# Patient Record
Sex: Male | Born: 1985 | Race: White | Hispanic: No | Marital: Single | Smoking: Current every day smoker
Health system: Southern US, Community
[De-identification: ages and names within clinical notes are randomized; demographics above are authoritative.]

## PROBLEM LIST (undated history)

## (undated) DIAGNOSIS — T300 Burn of unspecified body region, unspecified degree: Secondary | ICD-10-CM

## (undated) DIAGNOSIS — W868XXA Exposure to other electric current, initial encounter: Secondary | ICD-10-CM

## (undated) HISTORY — PX: OTHER SURGICAL HISTORY: SHX169

---

## 2013-09-17 ENCOUNTER — Encounter (HOSPITAL_COMMUNITY): Payer: Self-pay | Admitting: Emergency Medicine

## 2013-09-17 ENCOUNTER — Emergency Department (HOSPITAL_COMMUNITY): Payer: Self-pay

## 2013-09-17 ENCOUNTER — Emergency Department (HOSPITAL_COMMUNITY)
Admission: EM | Admit: 2013-09-17 | Discharge: 2013-09-17 | Disposition: A | Discharge status: Home / Self Care | Payer: Self-pay | Attending: Emergency Medicine | Admitting: Emergency Medicine

## 2013-09-17 DIAGNOSIS — F17200 Nicotine dependence, unspecified, uncomplicated: Secondary | ICD-10-CM | POA: Insufficient documentation

## 2013-09-17 DIAGNOSIS — N390 Urinary tract infection, site not specified: Secondary | ICD-10-CM | POA: Insufficient documentation

## 2013-09-17 DIAGNOSIS — R1011 Right upper quadrant pain: Secondary | ICD-10-CM | POA: Insufficient documentation

## 2013-09-17 DIAGNOSIS — Z880 Allergy status to penicillin: Secondary | ICD-10-CM | POA: Insufficient documentation

## 2013-09-17 DIAGNOSIS — R112 Nausea with vomiting, unspecified: Secondary | ICD-10-CM | POA: Insufficient documentation

## 2013-09-17 HISTORY — DX: Burn of unspecified body region, unspecified degree: T30.0

## 2013-09-17 HISTORY — DX: Exposure to other electric current, initial encounter: W86.8XXA

## 2013-09-17 LAB — URINE MICROSCOPIC-ADD ON

## 2013-09-17 LAB — COMPREHENSIVE METABOLIC PANEL
ALK PHOS: 67 U/L (ref 39–117)
ALT: 14 U/L (ref 0–53)
AST: 20 U/L (ref 0–37)
Albumin: 4.5 g/dL (ref 3.5–5.2)
Anion gap: 11 (ref 5–15)
BUN: 9 mg/dL (ref 6–23)
CHLORIDE: 107 meq/L (ref 96–112)
CO2: 25 meq/L (ref 19–32)
Calcium: 9.6 mg/dL (ref 8.4–10.5)
Creatinine, Ser: 0.9 mg/dL (ref 0.50–1.35)
GFR calc Af Amer: >90 mL/min (ref >90)
Glucose, Bld: 92 mg/dL (ref 70–99)
POTASSIUM: 3.9 meq/L (ref 3.7–5.3)
SODIUM: 143 meq/L (ref 137–147)
Total Bilirubin: 0.6 mg/dL (ref 0.3–1.2)
Total Protein: 8.2 g/dL (ref 6.0–8.3)

## 2013-09-17 LAB — CBC WITH DIFFERENTIAL/PLATELET
Basophils Absolute: 0 10*3/uL (ref 0.0–0.1)
Basophils Relative: 0 % (ref 0–1)
Eosinophils Absolute: 0.2 10*3/uL (ref 0.0–0.7)
Eosinophils Relative: 2 % (ref 0–5)
HCT: 41.1 % (ref 39.0–52.0)
Hemoglobin: 13.6 g/dL (ref 13.0–17.0)
LYMPHS ABS: 2.5 10*3/uL (ref 0.7–4.0)
Lymphocytes Relative: 38 % (ref 12–46)
MCH: 30.6 pg (ref 26.0–34.0)
MCHC: 33.1 g/dL (ref 30.0–36.0)
MCV: 92.4 fL (ref 78.0–100.0)
Monocytes Absolute: 0.5 10*3/uL (ref 0.1–1.0)
Monocytes Relative: 7 % (ref 3–12)
NEUTROS ABS: 3.5 10*3/uL (ref 1.7–7.7)
NEUTROS PCT: 53 % (ref 43–77)
PLATELETS: 261 10*3/uL (ref 150–400)
RBC: 4.45 MIL/uL (ref 4.22–5.81)
RDW: 15.3 % (ref 11.5–15.5)
WBC: 6.7 10*3/uL (ref 4.0–10.5)

## 2013-09-17 LAB — URINALYSIS, ROUTINE W REFLEX MICROSCOPIC
BILIRUBIN URINE: NEGATIVE
GLUCOSE, UA: NEGATIVE mg/dL
Ketones, ur: NEGATIVE mg/dL
Leukocytes, UA: NEGATIVE
Nitrite: NEGATIVE
Specific Gravity, Urine: >1.03 — ABNORMAL HIGH (ref 1.005–1.030)
UROBILINOGEN UA: 0.2 mg/dL (ref 0.0–1.0)
pH: 5.5 (ref 5.0–8.0)

## 2013-09-17 LAB — LIPASE, BLOOD: Lipase: 37 U/L (ref 11–59)

## 2013-09-17 MED ORDER — IOHEXOL 300 MG/ML  SOLN
100.0000 mL | Freq: Once | INTRAMUSCULAR | Status: AC | PRN
  Administered 2013-09-17: 100 mL via INTRAVENOUS

## 2013-09-17 MED ORDER — SODIUM CHLORIDE 0.9 % IV SOLN
Freq: Once | INTRAVENOUS | Status: AC
  Administered 2013-09-17: 10:00:00 via INTRAVENOUS

## 2013-09-17 MED ORDER — OXYCODONE-ACETAMINOPHEN 7.5-325 MG PO TABS: 1.0000 | ORAL_TABLET | ORAL | Status: DC | PRN

## 2013-09-17 MED ORDER — CIPROFLOXACIN HCL 500 MG PO TABS: 500.0000 mg | ORAL_TABLET | Freq: Two times a day (BID) | ORAL | Status: DC

## 2013-09-17 MED ORDER — PROMETHAZINE HCL 25 MG PO TABS: 25.0000 mg | ORAL_TABLET | Freq: Four times a day (QID) | ORAL | Status: DC | PRN

## 2013-09-17 MED ORDER — ONDANSETRON HCL 4 MG/2ML IJ SOLN
4.0000 mg | Freq: Once | INTRAMUSCULAR | Status: AC
  Administered 2013-09-17: 4 mg via INTRAVENOUS
  Filled 2013-09-17: qty 2

## 2013-09-17 MED ORDER — HYDROMORPHONE HCL PF 1 MG/ML IJ SOLN
1.0000 mg | Freq: Once | INTRAMUSCULAR | Status: AC
  Administered 2013-09-17: 1 mg via INTRAVENOUS
  Filled 2013-09-17: qty 1

## 2013-09-17 MED ORDER — MORPHINE SULFATE 4 MG/ML IJ SOLN
4.0000 mg | Freq: Once | INTRAMUSCULAR | Status: AC
  Administered 2013-09-17: 4 mg via INTRAVENOUS
  Filled 2013-09-17: qty 1

## 2013-09-17 NOTE — ED Notes (Signed)
Pt alert & oriented x4, stable gait. Patient given discharge instructions, paperwork & prescription(s). Patient  instructed to stop at the registration desk to finish any additional paperwork. Patient verbalized understanding. Pt left department w/ no further questions. 

## 2013-09-17 NOTE — ED Notes (Addendum)
PT starting having n/v this am x4 since 0500 with right upper quadrant pain.
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
## 2013-09-17 NOTE — Care Management Note (Signed)
ED/CM noted patient did not have health insurance and/or PCP listed in the computer.  Patient was given the [facility] with information on the clinics, food pantries, and the handout for new health insurance sign-up.  Patient expressed appreciation for information received. Pt was also given a Rx discount card. He lives in [place], but stated he would like the [employer] anyway.

## 2013-09-19 LAB — URINE CULTURE
CULTURE: NO GROWTH
Colony Count: NO GROWTH

## 2013-09-21 NOTE — ED Provider Notes (Signed)
Medical screening examination/treatment/procedure(s) were performed by non-physician practitioner and as supervising physician I was immediately available for consultation/collaboration.   EKG Interpretation None        Lucian Baswell L Susette Seminara, MD 09/21/13 1347 

## 2014-05-31 ENCOUNTER — Encounter (HOSPITAL_COMMUNITY): Payer: Self-pay | Admitting: *Deleted

## 2014-05-31 ENCOUNTER — Emergency Department (HOSPITAL_COMMUNITY): Payer: Self-pay

## 2014-05-31 ENCOUNTER — Emergency Department (HOSPITAL_COMMUNITY)
Admission: EM | Admit: 2014-05-31 | Discharge: 2014-06-01 | Disposition: A | Discharge status: Home / Self Care | Payer: Self-pay | Attending: Emergency Medicine | Admitting: Emergency Medicine

## 2014-05-31 DIAGNOSIS — Z880 Allergy status to penicillin: Secondary | ICD-10-CM | POA: Insufficient documentation

## 2014-05-31 DIAGNOSIS — Y9389 Activity, other specified: Secondary | ICD-10-CM | POA: Insufficient documentation

## 2014-05-31 DIAGNOSIS — S62339A Displaced fracture of neck of unspecified metacarpal bone, initial encounter for closed fracture: Secondary | ICD-10-CM

## 2014-05-31 DIAGNOSIS — S199XXA Unspecified injury of neck, initial encounter: Secondary | ICD-10-CM | POA: Insufficient documentation

## 2014-05-31 DIAGNOSIS — S3992XA Unspecified injury of lower back, initial encounter: Secondary | ICD-10-CM | POA: Insufficient documentation

## 2014-05-31 DIAGNOSIS — M79643 Pain in unspecified hand: Secondary | ICD-10-CM

## 2014-05-31 DIAGNOSIS — W108XXA Fall (on) (from) other stairs and steps, initial encounter: Secondary | ICD-10-CM | POA: Insufficient documentation

## 2014-05-31 DIAGNOSIS — W19XXXA Unspecified fall, initial encounter: Secondary | ICD-10-CM

## 2014-05-31 DIAGNOSIS — S62337A Displaced fracture of neck of fifth metacarpal bone, left hand, initial encounter for closed fracture: Secondary | ICD-10-CM | POA: Insufficient documentation

## 2014-05-31 DIAGNOSIS — Z720 Tobacco use: Secondary | ICD-10-CM | POA: Insufficient documentation

## 2014-05-31 DIAGNOSIS — Y998 Other external cause status: Secondary | ICD-10-CM | POA: Insufficient documentation

## 2014-05-31 DIAGNOSIS — Z79899 Other long term (current) drug therapy: Secondary | ICD-10-CM | POA: Insufficient documentation

## 2014-05-31 DIAGNOSIS — Z792 Long term (current) use of antibiotics: Secondary | ICD-10-CM | POA: Insufficient documentation

## 2014-05-31 DIAGNOSIS — Y9289 Other specified places as the place of occurrence of the external cause: Secondary | ICD-10-CM | POA: Insufficient documentation

## 2014-05-31 MED ORDER — HYDROMORPHONE HCL 2 MG PO TABS
4.0000 mg | ORAL_TABLET | Freq: Once | ORAL | Status: AC
  Administered 2014-05-31: 4 mg via ORAL
  Filled 2014-05-31: qty 2

## 2014-05-31 NOTE — Discharge Instructions (Signed)
Boxer's Fracture °You have a break (fracture) of the fifth metacarpal bone. This is commonly called a boxer's fracture. This is the bone in the hand where the little finger attaches. The fracture is in the end of that bone, closest to the little finger. It is usually caused when you hit an object with a clenched fist. Often, the knuckle is pushed down by the impact. Sometimes, the fracture rotates out of position. A boxer's fracture will usually heal within 6 weeks, if it is treated properly and protected from re-injury. Surgery is sometimes needed. °A cast, splint, or bulky hand dressing may be used to protect and immobilize a boxer's fracture. Do not remove this device or dressing until your caregiver approves. Keep your hand elevated, and apply ice packs for 15-20 minutes every 2 hours, for the first 2 days. Elevation and ice help reduce swelling and relieve pain. See your caregiver, or an orthopedic specialist, for follow-up care within the next 10 days. This is to make sure your fracture is healing properly. °Document Released: 01/01/2005 Document Revised: 03/26/2011 Document Reviewed: 06/21/2006 °ExitCare® Patient Information ©2015 [employer]. This information is not intended to replace advice given to you by your health care provider. Make sure you discuss any questions you have with your health care provider. ° °

## 2014-05-31 NOTE — ED Notes (Signed)
Patient given instructions about sling until seen by Ortho

## 2014-05-31 NOTE — ED Notes (Signed)
Pt c/o left hand pain; pt states he fell down some stairs x 2 days ago; pt has positive radial pulse; pt has swelling to pinky and outer hand
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
## 2015-02-08 ENCOUNTER — Emergency Department (HOSPITAL_COMMUNITY): Payer: Self-pay

## 2015-02-08 ENCOUNTER — Encounter (HOSPITAL_COMMUNITY): Payer: Self-pay | Admitting: Emergency Medicine

## 2015-02-08 ENCOUNTER — Emergency Department (HOSPITAL_COMMUNITY)
Admission: EM | Admit: 2015-02-08 | Discharge: 2015-02-08 | Discharge status: Against Medical Advice | Payer: Self-pay | Attending: Emergency Medicine | Admitting: Emergency Medicine

## 2015-02-08 DIAGNOSIS — M79642 Pain in left hand: Secondary | ICD-10-CM | POA: Insufficient documentation

## 2015-02-08 DIAGNOSIS — W1839XA Other fall on same level, initial encounter: Secondary | ICD-10-CM | POA: Insufficient documentation

## 2015-02-08 DIAGNOSIS — Z880 Allergy status to penicillin: Secondary | ICD-10-CM | POA: Insufficient documentation

## 2015-02-08 DIAGNOSIS — F17210 Nicotine dependence, cigarettes, uncomplicated: Secondary | ICD-10-CM | POA: Insufficient documentation

## 2015-02-08 DIAGNOSIS — Y9289 Other specified places as the place of occurrence of the external cause: Secondary | ICD-10-CM | POA: Insufficient documentation

## 2015-02-08 DIAGNOSIS — Y9389 Activity, other specified: Secondary | ICD-10-CM | POA: Insufficient documentation

## 2015-02-08 DIAGNOSIS — S61215A Laceration without foreign body of left ring finger without damage to nail, initial encounter: Secondary | ICD-10-CM | POA: Insufficient documentation

## 2015-02-08 DIAGNOSIS — Y990 Civilian activity done for income or pay: Secondary | ICD-10-CM | POA: Insufficient documentation

## 2015-02-08 DIAGNOSIS — R000 Tachycardia, unspecified: Secondary | ICD-10-CM | POA: Insufficient documentation

## 2015-02-08 DIAGNOSIS — L03114 Cellulitis of left upper limb: Secondary | ICD-10-CM | POA: Insufficient documentation

## 2015-02-08 DIAGNOSIS — L03119 Cellulitis of unspecified part of limb: Secondary | ICD-10-CM

## 2015-02-08 LAB — CBC WITH DIFFERENTIAL/PLATELET
BASOS PCT: 0 %
Basophils Absolute: 0 10*3/uL (ref 0.0–0.1)
EOS ABS: 0.1 10*3/uL (ref 0.0–0.7)
EOS PCT: 1 %
HCT: 39.8 % (ref 39.0–52.0)
HEMOGLOBIN: 13.7 g/dL (ref 13.0–17.0)
LYMPHS ABS: 3.7 10*3/uL (ref 0.7–4.0)
Lymphocytes Relative: 30 %
MCH: 32.4 pg (ref 26.0–34.0)
MCHC: 34.4 g/dL (ref 30.0–36.0)
MCV: 94.1 fL (ref 78.0–100.0)
MONO ABS: 0.8 10*3/uL (ref 0.1–1.0)
MONOS PCT: 6 %
NEUTROS PCT: 63 %
Neutro Abs: 7.7 10*3/uL (ref 1.7–7.7)
Platelets: 206 10*3/uL (ref 150–400)
RBC: 4.23 MIL/uL (ref 4.22–5.81)
RDW: 13.1 % (ref 11.5–15.5)
WBC: 12.3 10*3/uL — ABNORMAL HIGH (ref 4.0–10.5)

## 2015-02-08 LAB — BASIC METABOLIC PANEL
Anion gap: 10 (ref 5–15)
BUN: 15 mg/dL (ref 6–20)
CHLORIDE: 106 mmol/L (ref 101–111)
CO2: 22 mmol/L (ref 22–32)
Calcium: 9.3 mg/dL (ref 8.9–10.3)
Creatinine, Ser: 0.78 mg/dL (ref 0.61–1.24)
GFR calc Af Amer: >60 mL/min (ref >60)
GFR calc non Af Amer: >60 mL/min (ref >60)
Glucose, Bld: 110 mg/dL — ABNORMAL HIGH (ref 65–99)
POTASSIUM: 4 mmol/L (ref 3.5–5.1)
SODIUM: 138 mmol/L (ref 135–145)

## 2015-02-08 MED ORDER — IBUPROFEN 400 MG PO TABS
600.0000 mg | ORAL_TABLET | Freq: Once | ORAL | Status: DC
  Filled 2015-02-08: qty 2

## 2015-02-08 NOTE — ED Notes (Signed)
Pt c/o left hand and wrist pain after falling on it yesterday. The wrist is red, warm to touch and swollen.

## 2015-02-08 NOTE — ED Notes (Signed)
Patient walked out of ED. Patient refused to sign out AMA. When asked patient to sign he continued to walk out of ED
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
## 2015-02-09 ENCOUNTER — Encounter (HOSPITAL_COMMUNITY): Payer: Self-pay | Admitting: Emergency Medicine

## 2015-02-09 MED ORDER — CLINDAMYCIN HCL 150 MG PO CAPS: 450.0000 mg | ORAL_CAPSULE | Freq: Four times a day (QID) | ORAL | Status: AC

## 2015-02-09 MED ORDER — SULFAMETHOXAZOLE-TRIMETHOPRIM 800-160 MG PO TABS: 1.0000 | ORAL_TABLET | Freq: Two times a day (BID) | ORAL | Status: AC

## 2017-07-31 IMAGING — DX DG WRIST COMPLETE 3+V*L*
4 series · 4 of 4 positions shown · non-contrast
Comparison: 05/31/2014

CLINICAL DATA: Fell on left hand, pain and swelling left wrist

EXAM:
LEFT WRIST - COMPLETE 3+ VIEW

[wrist pa (1 of 2)]
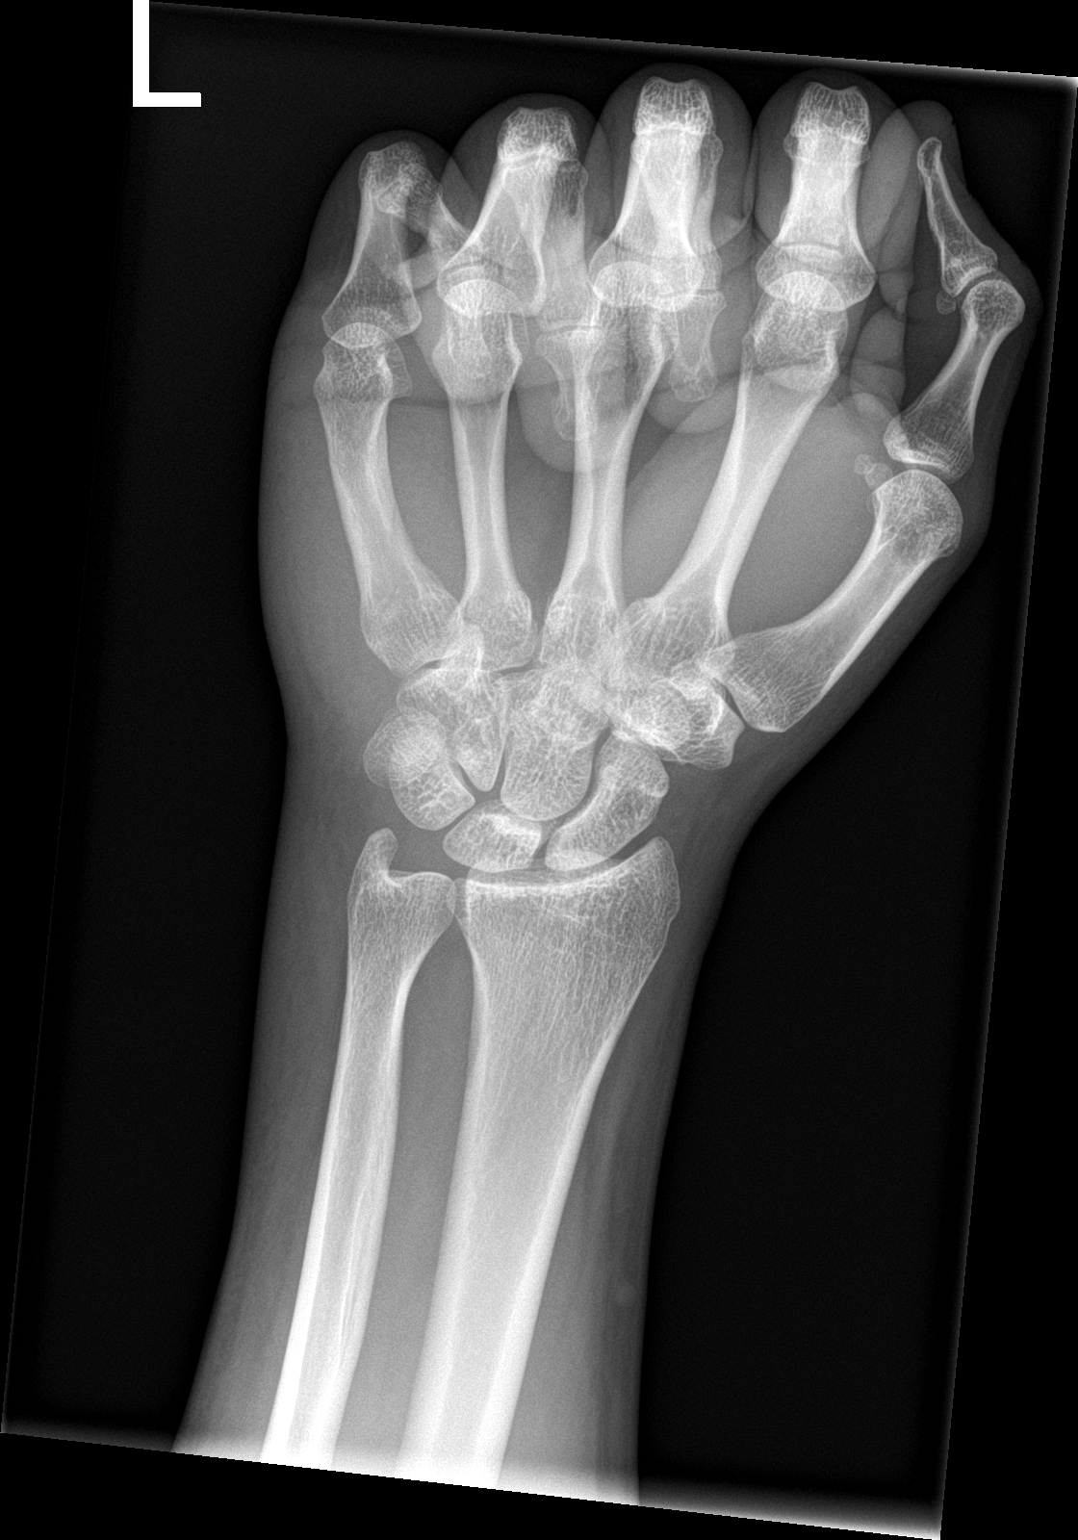

[wrist obl]
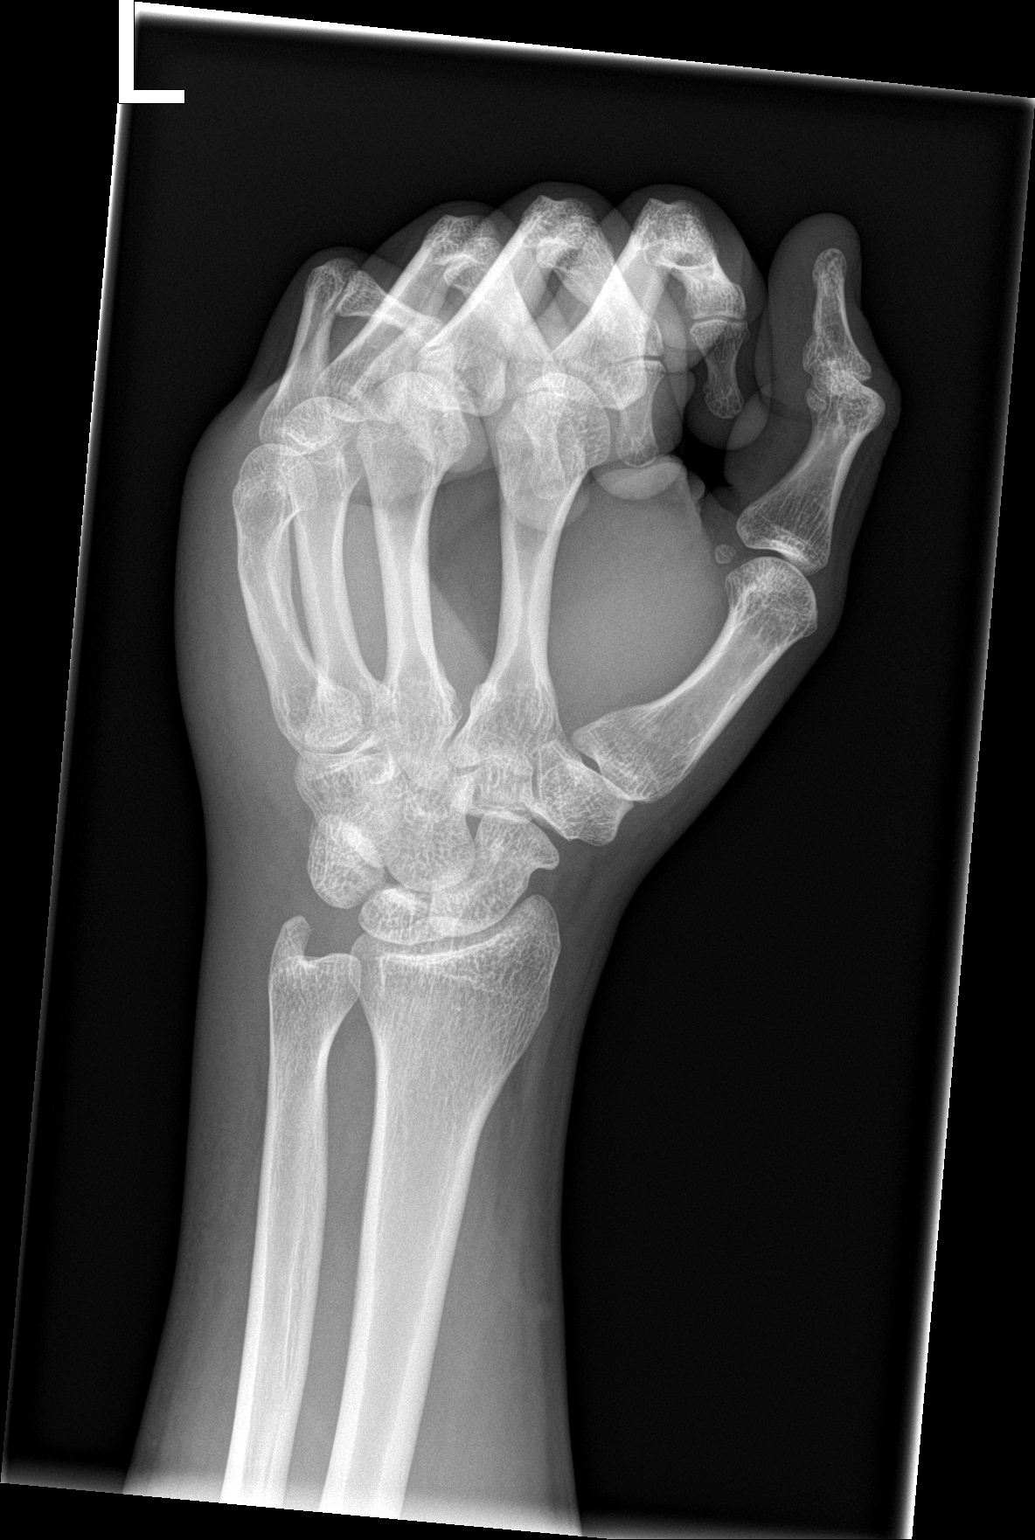

[wrist lat]
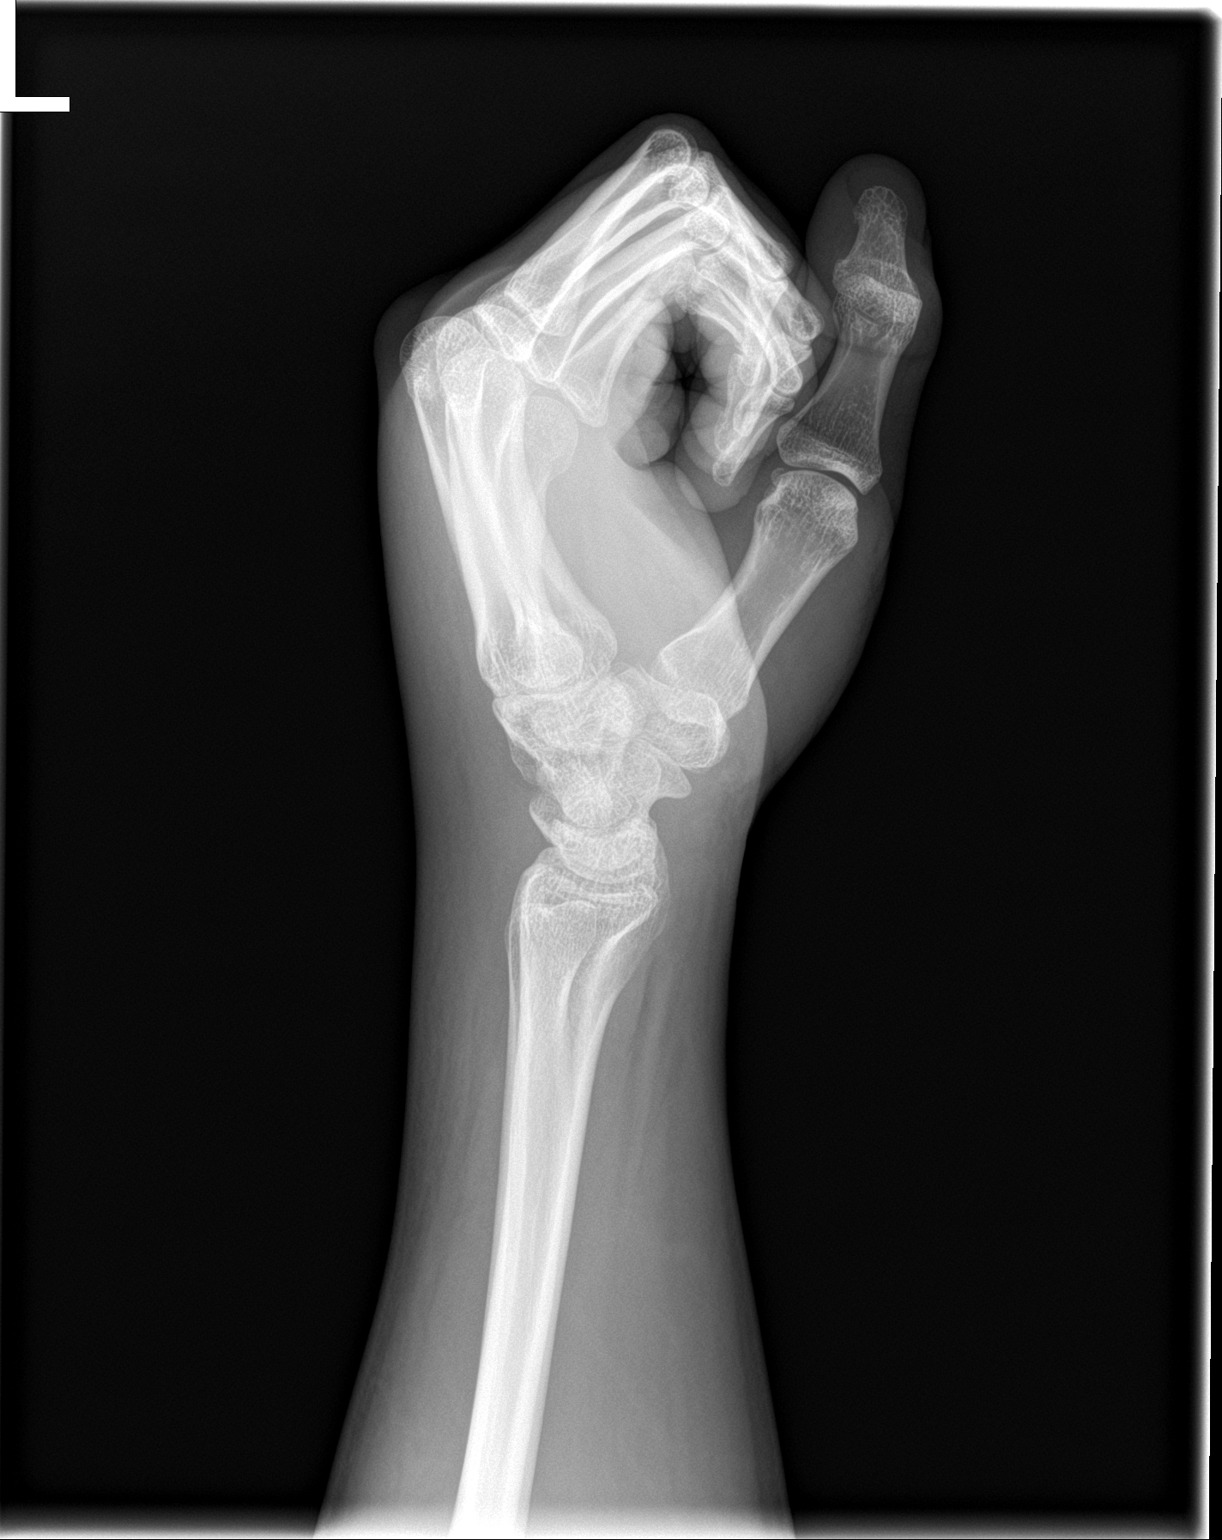

[wrist pa (2 of 2)]
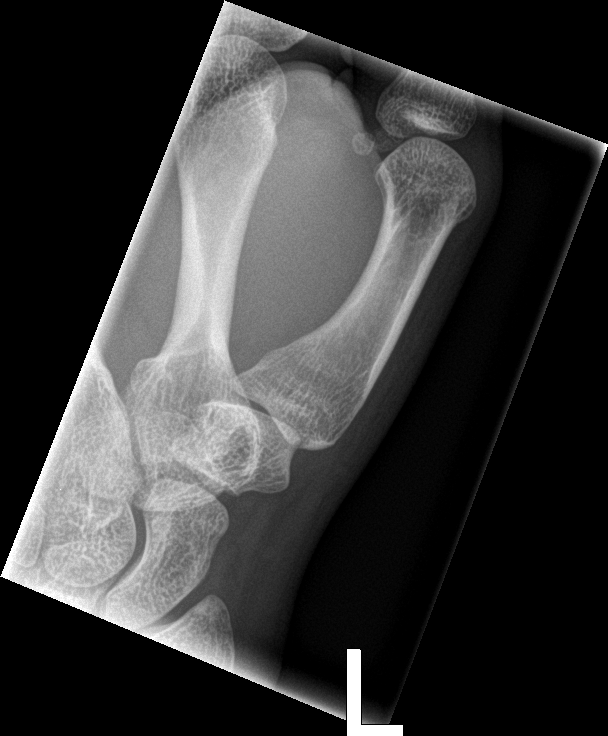

[4 of 4 positions shown; findings below may reference images not displayed]

FINDINGS: Deformity left fifth metacarpal from prior fracture. No acute
fracture or dislocation.
IMPRESSION: No acute finding
